# Patient Record
Sex: Male | Born: 1985 | Race: White | Hispanic: No | Marital: Single | Smoking: Never smoker
Health system: Southern US, Community
[De-identification: ages and names within clinical notes are randomized; demographics above are authoritative.]

## PROBLEM LIST (undated history)

## (undated) HISTORY — PX: FOOT SURGERY: SHX648

---

## 2008-09-01 ENCOUNTER — Emergency Department: Payer: Self-pay | Admitting: Emergency Medicine

## 2008-09-03 ENCOUNTER — Emergency Department: Payer: Self-pay | Admitting: Emergency Medicine

## 2008-09-08 ENCOUNTER — Emergency Department: Payer: Self-pay | Admitting: Internal Medicine

## 2013-04-06 ENCOUNTER — Encounter (HOSPITAL_COMMUNITY): Payer: Self-pay | Admitting: *Deleted

## 2013-04-06 ENCOUNTER — Emergency Department (HOSPITAL_COMMUNITY)
Admission: EM | Admit: 2013-04-06 | Discharge: 2013-04-06 | Discharge status: Against Medical Advice | Payer: BC Managed Care – PPO | Attending: Emergency Medicine | Admitting: Emergency Medicine

## 2013-04-06 DIAGNOSIS — H5789 Other specified disorders of eye and adnexa: Secondary | ICD-10-CM | POA: Insufficient documentation

## 2013-04-06 DIAGNOSIS — H5710 Ocular pain, unspecified eye: Secondary | ICD-10-CM | POA: Insufficient documentation
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
## 2013-05-16 ENCOUNTER — Encounter (HOSPITAL_COMMUNITY): Payer: Self-pay | Admitting: Emergency Medicine

## 2013-05-16 ENCOUNTER — Emergency Department (HOSPITAL_COMMUNITY)
Admission: EM | Admit: 2013-05-16 | Discharge: 2013-05-16 | Disposition: A | Discharge status: Home / Self Care | Payer: BC Managed Care – PPO | Attending: Emergency Medicine | Admitting: Emergency Medicine

## 2013-05-16 DIAGNOSIS — R21 Rash and other nonspecific skin eruption: Secondary | ICD-10-CM | POA: Insufficient documentation

## 2013-05-16 DIAGNOSIS — L237 Allergic contact dermatitis due to plants, except food: Secondary | ICD-10-CM

## 2013-05-16 DIAGNOSIS — L255 Unspecified contact dermatitis due to plants, except food: Secondary | ICD-10-CM | POA: Insufficient documentation

## 2013-05-16 MED ORDER — DEXAMETHASONE SODIUM PHOSPHATE 10 MG/ML IJ SOLN
10.0000 mg | Freq: Once | INTRAMUSCULAR | Status: AC
  Administered 2013-05-16: 10 mg via INTRAVENOUS
  Filled 2013-05-16: qty 1

## 2013-05-16 MED ORDER — PREDNISONE 20 MG PO TABS: 40.0000 mg | ORAL_TABLET | Freq: Every day | ORAL | Status: AC

## 2013-05-16 MED ORDER — DIPHENHYDRAMINE HCL 25 MG PO TABS: 25.0000 mg | ORAL_TABLET | Freq: Four times a day (QID) | ORAL | Status: DC | PRN

## 2013-05-16 NOTE — ED Notes (Signed)
Poison ivy started 2 days ago-- on arms, now has spread to legs/shoulder/face area

## 2013-05-16 NOTE — ED Provider Notes (Signed)
Medical screening examination/treatment/procedure(s) were performed by non-physician practitioner and as supervising physician I was immediately available for consultation/collaboration.  Ethelda Chick, MD 05/16/13 [phone]

## 2013-05-16 NOTE — ED Provider Notes (Signed)
  CSN: [id number]     Arrival date & time 05/16/13  0729 History     First MD Initiated Contact with Patient 05/16/13 0732     Chief Complaint  Patient presents with  . Poison Ivy   (Consider location/radiation/quality/duration/timing/severity/associated sxs/prior Treatment) Patient is a 27 y.o. male presenting with poison ivy. The history is provided by the patient. No language interpreter was used.  Poison Lajoyce Corners This is a new problem. The current episode started in the past 7 days. The problem occurs constantly. The problem has been gradually worsening. Associated symptoms include a rash. Nothing aggravates the symptoms. Treatments tried: calamine, hydrocortizone cream. The treatment provided mild relief.    History reviewed. No pertinent past medical history. Past Surgical History  Procedure Laterality Date  . Foot surgery     History reviewed. No pertinent family history. History  Substance Use Topics  . Smoking status: Never Smoker   . Smokeless tobacco: Not on file  . Alcohol Use: No     Comment: occasionally    Review of Systems  Skin: Positive for rash.  All other systems reviewed and are negative.    Allergies  Review of patient's allergies indicates no known allergies.  Home Medications  No current outpatient prescriptions on file. BP 137/57  Pulse 58  Temp(Src) 97.9 F (36.6 C) (Oral)  Resp 19  SpO2 97% Physical Exam  Nursing note and vitals reviewed. Constitutional: He is oriented to person, place, and time. He appears well-developed and well-nourished.  HENT:  Head: Normocephalic and atraumatic.  Eyes: Conjunctivae and EOM are normal.  Neck: Normal range of motion.  Cardiovascular: Normal rate.   Pulmonary/Chest: Effort normal.  Abdominal: He exhibits no distension.  Musculoskeletal: Normal range of motion.  Neurological: He is alert and oriented to person, place, and time.  Skin: Skin is dry. Rash noted.  Mild diffuse rash, characteristic of  poison ivy, spares the eyes, ears, and lips  Psychiatric: He has a normal mood and affect. His behavior is normal. Judgment and thought content normal.    ED Course   Procedures (including critical care time)  Labs Reviewed - No data to display No results found. 1. Poison ivy     MDM  Poison Ivy Discharge to home with prednisone. Will give decadron here.  Encouraged benadryl.   Roxy Horseman, PA-C 05/16/13 [phone]

## 2013-05-19 ENCOUNTER — Encounter (HOSPITAL_COMMUNITY): Payer: Self-pay | Admitting: *Deleted

## 2013-05-19 ENCOUNTER — Emergency Department (HOSPITAL_COMMUNITY)
Admission: EM | Admit: 2013-05-19 | Discharge: 2013-05-19 | Disposition: A | Discharge status: Home / Self Care | Payer: BC Managed Care – PPO | Attending: Emergency Medicine | Admitting: Emergency Medicine

## 2013-05-19 DIAGNOSIS — L255 Unspecified contact dermatitis due to plants, except food: Secondary | ICD-10-CM | POA: Insufficient documentation

## 2013-05-19 MED ORDER — CEPHALEXIN 500 MG PO CAPS: 500.0000 mg | ORAL_CAPSULE | Freq: Four times a day (QID) | ORAL | Status: AC

## 2013-05-19 MED ORDER — TRIAMCINOLONE ACETONIDE 0.025 % EX OINT: TOPICAL_OINTMENT | Freq: Two times a day (BID) | CUTANEOUS | Status: AC

## 2013-05-19 MED ORDER — HYDROXYZINE HCL 25 MG PO TABS: 25.0000 mg | ORAL_TABLET | Freq: Four times a day (QID) | ORAL | Status: AC

## 2013-05-19 NOTE — ED Notes (Signed)
The pt is c/o a rash for one week

## 2013-05-19 NOTE — ED Provider Notes (Signed)
CSN: [id number]     Arrival date & time 05/19/13  1503 History  This chart was scribed for non-physician practitioner Arthor Captain, PA-C, working with Gilda Crease, by Yevette Edwards, ED Scribe. This patient was seen in room TR08C/TR08C and the patient's care was started at 3:08 PM.   First MD Initiated Contact with Patient 05/19/13 1507     No chief complaint on file.   The history is provided by the patient. No language interpreter was used.   HPI Comments: Raymond Callahan is a 27 y.o. male who presents to the Emergency Department complaining of poison ivy which began six days ago. The poison ivy began on his arms and his face. He visited this ED fours days ago for treatment, but he reports that the rash has continued to spread. Since the visit, it has spread to his fingers, his eye, and his medial thighs. The pt states he has experienced yellowish drainage from a few of the affected sites. He denies experiencing any fever or pain. He has attempted to mitigate his symptoms with prednisone and Benadryl, but with little resolution.   No past medical history on file. Past Surgical History  Procedure Laterality Date  . Foot surgery     No family history on file. History  Substance Use Topics  . Smoking status: Never Smoker   . Smokeless tobacco: Not on file  . Alcohol Use: No     Comment: occasionally    Review of Systems  Constitutional: Negative for fever and chills.  Skin: Positive for rash.  Allergic/Immunologic: Positive for environmental allergies.  All other systems reviewed and are negative.    Allergies  Review of patient's allergies indicates no known allergies.  Home Medications   Current Outpatient Rx  Name  Route  Sig  Dispense  Refill  . diphenhydrAMINE (BENADRYL) 25 MG tablet   Oral   Take 1 tablet (25 mg total) by mouth every 6 (six) hours as needed for itching (Rash).   30 tablet   0   . predniSONE (DELTASONE) 20 MG tablet   Oral   Take 2  tablets (40 mg total) by mouth daily. Take 40 mg by mouth daily for 4 days, then 20mg  by mouth daily for 4 days, then 10mg  daily for 4 days   16 tablet   0    Triage Vitals: BP 121/81  Pulse 59  Temp(Src) 97 F (36.1 C) (Oral)  Resp 16  SpO2 97%  Physical Exam  Nursing note and vitals reviewed. Constitutional: He is oriented to person, place, and time. He appears well-developed and well-nourished. No distress.  HENT:  Head: Normocephalic and atraumatic.  Eyes: EOM are normal.  Neck: Neck supple. No tracheal deviation present.  Cardiovascular: Normal rate, regular rhythm and normal heart sounds.  Exam reveals no friction rub.   No murmur heard. Pulmonary/Chest: Effort normal and breath sounds normal. No respiratory distress. He has no wheezes.  Musculoskeletal: Normal range of motion.  Neurological: He is alert and oriented to person, place, and time.  Skin: Skin is warm and dry.  Multiple areas of singular and confluent eruption on face, hands, arms, and legs. There is an areas of excoriation and weeping consistent with poison ivy. They are in various stages of healing and eruption. No signs secondary infection.  Psychiatric: He has a normal mood and affect. His behavior is normal.    ED Course   DIAGNOSTIC STUDIES: Oxygen Saturation is 97% on room air, room air by  my interpretation.    COORDINATION OF CARE:  3:09 PM-.Discussed treatment plan with patient which was an explanation of the course of poison ivy, topical steroid, and an antibiotic in case his rash becomes infected, and the patient agreed to the plan.   Procedures (including critical care time)  Labs Reviewed - No data to display No results found. 1. Allergy to poison ivy, subsequent encounter     MDM  Poison Ivy Pt presentation consistant with poison ivy infection. Discussed contagiousness & home care.Strict return precautions discussed. Pt afebrile and in NAD prior to dc. Airway intact without compromise.   Patient will continue home oral steroid. Add topical not to be used on face. RX keflex  To hold incase of secondary infection. Atatrax. Domeboro soaks   I personally performed the services described in this documentation, which was scribed in my presence. The recorded information has been reviewed and is accurate.     Arthor Captain, PA-C 05/19/13 [phone]

## 2013-05-21 NOTE — ED Provider Notes (Signed)
Medical screening examination/treatment/procedure(s) were performed by non-physician practitioner and as supervising physician I was immediately available for consultation/collaboration.   Christopher J. Pollina, MD 05/21/13 0719 

## 2013-08-18 ENCOUNTER — Emergency Department: Payer: Self-pay | Admitting: Emergency Medicine

## 2013-09-07 ENCOUNTER — Emergency Department: Payer: Self-pay | Admitting: Emergency Medicine

## 2014-04-02 ENCOUNTER — Emergency Department: Payer: Self-pay | Admitting: Emergency Medicine

## 2016-07-31 ENCOUNTER — Emergency Department: Payer: BLUE CROSS/BLUE SHIELD

## 2016-07-31 ENCOUNTER — Encounter: Payer: Self-pay | Admitting: Emergency Medicine

## 2016-07-31 ENCOUNTER — Emergency Department
Admission: EM | Admit: 2016-07-31 | Discharge: 2016-07-31 | Disposition: A | Discharge status: Home / Self Care | Payer: BLUE CROSS/BLUE SHIELD | Attending: Emergency Medicine | Admitting: Emergency Medicine

## 2016-07-31 DIAGNOSIS — Y92410 Unspecified street and highway as the place of occurrence of the external cause: Secondary | ICD-10-CM | POA: Insufficient documentation

## 2016-07-31 DIAGNOSIS — Y999 Unspecified external cause status: Secondary | ICD-10-CM | POA: Diagnosis not present

## 2016-07-31 DIAGNOSIS — S0990XA Unspecified injury of head, initial encounter: Secondary | ICD-10-CM | POA: Diagnosis present

## 2016-07-31 DIAGNOSIS — S0083XA Contusion of other part of head, initial encounter: Secondary | ICD-10-CM | POA: Diagnosis not present

## 2016-07-31 DIAGNOSIS — S161XXA Strain of muscle, fascia and tendon at neck level, initial encounter: Secondary | ICD-10-CM | POA: Diagnosis not present

## 2016-07-31 DIAGNOSIS — S0093XA Contusion of unspecified part of head, initial encounter: Secondary | ICD-10-CM

## 2016-07-31 DIAGNOSIS — S39012A Strain of muscle, fascia and tendon of lower back, initial encounter: Secondary | ICD-10-CM | POA: Insufficient documentation

## 2016-07-31 DIAGNOSIS — Y9389 Activity, other specified: Secondary | ICD-10-CM | POA: Insufficient documentation

## 2016-07-31 MED ORDER — DIAZEPAM 2 MG PO TABS: 2.0000 mg | ORAL_TABLET | Freq: Three times a day (TID) | ORAL | 0 refills | Status: AC | PRN

## 2016-07-31 MED ORDER — DIAZEPAM 2 MG PO TABS
2.0000 mg | ORAL_TABLET | Freq: Once | ORAL | Status: AC
  Administered 2016-07-31: 2 mg via ORAL
  Filled 2016-07-31: qty 1

## 2016-07-31 MED ORDER — OXYCODONE-ACETAMINOPHEN 5-325 MG PO TABS
1.0000 | ORAL_TABLET | Freq: Once | ORAL | Status: AC
  Administered 2016-07-31: 1 via ORAL
  Filled 2016-07-31: qty 1

## 2016-07-31 MED ORDER — OXYCODONE-ACETAMINOPHEN 5-325 MG PO TABS: 1.0000 | ORAL_TABLET | ORAL | 0 refills | Status: AC | PRN
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
## 2016-07-31 NOTE — Discharge Instructions (Signed)
[facility]. Take medication only as directed. Percocet as needed for pain and diazepam as needed for muscle spasms. Did not take this medication and drive or operate machinery. Moist heat or ice to muscles as needed for pain.

## 2016-07-31 NOTE — ED Notes (Signed)
Pt returned from CT via stretcher.
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
## 2016-08-16 ENCOUNTER — Ambulatory Visit: Payer: No Typology Code available for payment source

## 2016-08-22 ENCOUNTER — Ambulatory Visit: Payer: BLUE CROSS/BLUE SHIELD | Attending: Orthopedic Surgery | Admitting: Physical Therapy

## 2016-08-22 DIAGNOSIS — M5442 Lumbago with sciatica, left side: Secondary | ICD-10-CM | POA: Insufficient documentation
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
## 2016-08-22 NOTE — Patient Instructions (Signed)
[email or web address]  DKTC  bil SKTC

## 2016-08-24 ENCOUNTER — Ambulatory Visit: Payer: BLUE CROSS/BLUE SHIELD | Admitting: Physical Therapy

## 2016-08-24 DIAGNOSIS — M5442 Lumbago with sciatica, left side: Secondary | ICD-10-CM | POA: Diagnosis not present
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
## 2016-08-30 ENCOUNTER — Ambulatory Visit: Payer: BLUE CROSS/BLUE SHIELD | Admitting: Physical Therapy

## 2016-08-30 ENCOUNTER — Encounter: Payer: Self-pay | Admitting: Physical Therapy

## 2016-08-30 DIAGNOSIS — M5442 Lumbago with sciatica, left side: Secondary | ICD-10-CM | POA: Diagnosis not present
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
## 2016-09-07 ENCOUNTER — Ambulatory Visit: Payer: BLUE CROSS/BLUE SHIELD | Admitting: Physical Therapy

## 2016-09-07 ENCOUNTER — Encounter: Payer: BLUE CROSS/BLUE SHIELD | Admitting: Physical Therapy

## 2016-09-13 ENCOUNTER — Ambulatory Visit: Payer: BLUE CROSS/BLUE SHIELD | Admitting: Physical Therapy

## 2016-09-13 DIAGNOSIS — M5442 Lumbago with sciatica, left side: Secondary | ICD-10-CM | POA: Diagnosis not present
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
## 2016-09-15 ENCOUNTER — Ambulatory Visit: Payer: BLUE CROSS/BLUE SHIELD | Admitting: Physical Therapy

## 2016-09-19 ENCOUNTER — Ambulatory Visit: Payer: BLUE CROSS/BLUE SHIELD | Attending: Orthopedic Surgery | Admitting: Physical Therapy

## 2016-09-19 DIAGNOSIS — M5442 Lumbago with sciatica, left side: Secondary | ICD-10-CM | POA: Insufficient documentation
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
## 2016-09-19 NOTE — Patient Instructions (Signed)
Pain on initial few degrees from neutral into flexion (painful), pain with L rotation, some with side bending bilaterally, some with extension.

## 2016-09-22 ENCOUNTER — Ambulatory Visit: Payer: BLUE CROSS/BLUE SHIELD | Admitting: Physical Therapy

## 2016-09-22 DIAGNOSIS — M5442 Lumbago with sciatica, left side: Secondary | ICD-10-CM
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
## 2016-09-27 ENCOUNTER — Telehealth: Payer: Self-pay | Admitting: Physical Therapy

## 2016-09-27 ENCOUNTER — Ambulatory Visit: Payer: BLUE CROSS/BLUE SHIELD | Admitting: Physical Therapy

## 2016-09-27 NOTE — Telephone Encounter (Signed)
Primary therapist called patient and left VM as he missed listed appointment time. There may have been miscommunication between therapist and patient regarding this session last week. Instructed patient to update therapist on status once he receives VM.   Kerin RansomPatrick A Xavi Tomasik, PT, DPT

## 2016-10-04 ENCOUNTER — Ambulatory Visit: Payer: BLUE CROSS/BLUE SHIELD | Admitting: Physical Therapy
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
